# Patient Record
Sex: Female | Born: 1990 | Race: Black or African American | Hispanic: No | Marital: Single | Smoking: Never smoker
Health system: Southern US, Community
[De-identification: ages and names within clinical notes are randomized; demographics above are authoritative.]

---

## 2016-06-11 ENCOUNTER — Emergency Department (HOSPITAL_BASED_OUTPATIENT_CLINIC_OR_DEPARTMENT_OTHER): Payer: No Typology Code available for payment source

## 2016-06-11 ENCOUNTER — Encounter (HOSPITAL_BASED_OUTPATIENT_CLINIC_OR_DEPARTMENT_OTHER): Payer: Self-pay | Admitting: *Deleted

## 2016-06-11 ENCOUNTER — Emergency Department (HOSPITAL_BASED_OUTPATIENT_CLINIC_OR_DEPARTMENT_OTHER)
Admission: EM | Admit: 2016-06-11 | Discharge: 2016-06-11 | Disposition: A | Discharge status: Home / Self Care | Payer: No Typology Code available for payment source | Attending: Emergency Medicine | Admitting: Emergency Medicine

## 2016-06-11 DIAGNOSIS — Y92410 Unspecified street and highway as the place of occurrence of the external cause: Secondary | ICD-10-CM | POA: Insufficient documentation

## 2016-06-11 DIAGNOSIS — R0789 Other chest pain: Secondary | ICD-10-CM | POA: Insufficient documentation

## 2016-06-11 DIAGNOSIS — Y999 Unspecified external cause status: Secondary | ICD-10-CM | POA: Insufficient documentation

## 2016-06-11 DIAGNOSIS — S3991XA Unspecified injury of abdomen, initial encounter: Secondary | ICD-10-CM | POA: Diagnosis present

## 2016-06-11 DIAGNOSIS — S301XXA Contusion of abdominal wall, initial encounter: Secondary | ICD-10-CM | POA: Diagnosis not present

## 2016-06-11 DIAGNOSIS — Y9389 Activity, other specified: Secondary | ICD-10-CM | POA: Diagnosis not present

## 2016-06-11 LAB — BASIC METABOLIC PANEL
ANION GAP: 7 (ref 5–15)
BUN: 10 mg/dL (ref 6–20)
CALCIUM: 8.9 mg/dL (ref 8.9–10.3)
CO2: 26 mmol/L (ref 22–32)
Chloride: 105 mmol/L (ref 101–111)
Creatinine, Ser: 1.09 mg/dL — ABNORMAL HIGH (ref 0.44–1.00)
GLUCOSE: 95 mg/dL (ref 65–99)
Potassium: 3.5 mmol/L (ref 3.5–5.1)
SODIUM: 138 mmol/L (ref 135–145)

## 2016-06-11 LAB — CBC WITH DIFFERENTIAL/PLATELET
BASOS ABS: 0 10*3/uL (ref 0.0–0.1)
BASOS PCT: 0 %
EOS ABS: 0.1 10*3/uL (ref 0.0–0.7)
Eosinophils Relative: 1 %
HCT: 42.4 % (ref 36.0–46.0)
HEMOGLOBIN: 13.5 g/dL (ref 12.0–15.0)
Lymphocytes Relative: 15 %
Lymphs Abs: 1.7 10*3/uL (ref 0.7–4.0)
MCH: 27.6 pg (ref 26.0–34.0)
MCHC: 31.8 g/dL (ref 30.0–36.0)
MCV: 86.5 fL (ref 78.0–100.0)
MONOS PCT: 7 %
Monocytes Absolute: 0.9 10*3/uL (ref 0.1–1.0)
NEUTROS ABS: 9 10*3/uL — AB (ref 1.7–7.7)
NEUTROS PCT: 77 %
Platelets: 305 10*3/uL (ref 150–400)
RBC: 4.9 MIL/uL (ref 3.87–5.11)
RDW: 12.9 % (ref 11.5–15.5)
WBC: 11.8 10*3/uL — AB (ref 4.0–10.5)

## 2016-06-11 LAB — HCG, SERUM, QUALITATIVE: Preg, Serum: NEGATIVE

## 2016-06-11 MED ORDER — MORPHINE SULFATE (PF) 4 MG/ML IV SOLN
4.0000 mg | Freq: Once | INTRAVENOUS | Status: AC
  Administered 2016-06-11: 4 mg via INTRAVENOUS
  Filled 2016-06-11: qty 1

## 2016-06-11 MED ORDER — IOPAMIDOL (ISOVUE-300) INJECTION 61%
100.0000 mL | Freq: Once | INTRAVENOUS | Status: AC | PRN
  Administered 2016-06-11: 100 mL via INTRAVENOUS
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
## 2016-06-11 NOTE — ED Notes (Signed)
Pt back from CT
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
